# Patient Record
Sex: Male | Born: 1990 | Race: Black or African American | Hispanic: No | Smoking: Former smoker
Health system: Southern US, Community
[De-identification: ages and names within clinical notes are randomized; demographics above are authoritative.]

---

## 2018-04-25 ENCOUNTER — Emergency Department (HOSPITAL_COMMUNITY): Payer: BLUE CROSS/BLUE SHIELD

## 2018-04-25 ENCOUNTER — Encounter (HOSPITAL_COMMUNITY): Payer: Self-pay

## 2018-04-25 ENCOUNTER — Emergency Department (HOSPITAL_COMMUNITY)
Admission: EM | Admit: 2018-04-25 | Discharge: 2018-04-25 | Disposition: A | Discharge status: Home / Self Care | Payer: BLUE CROSS/BLUE SHIELD | Attending: Emergency Medicine | Admitting: Emergency Medicine

## 2018-04-25 ENCOUNTER — Other Ambulatory Visit: Payer: Self-pay

## 2018-04-25 DIAGNOSIS — L97919 Non-pressure chronic ulcer of unspecified part of right lower leg with unspecified severity: Secondary | ICD-10-CM | POA: Diagnosis not present

## 2018-04-25 DIAGNOSIS — L97929 Non-pressure chronic ulcer of unspecified part of left lower leg with unspecified severity: Secondary | ICD-10-CM

## 2018-04-25 DIAGNOSIS — D649 Anemia, unspecified: Secondary | ICD-10-CM

## 2018-04-25 DIAGNOSIS — M79606 Pain in leg, unspecified: Secondary | ICD-10-CM | POA: Diagnosis present

## 2018-04-25 LAB — CBC WITH DIFFERENTIAL/PLATELET
Abs Immature Granulocytes: 0.01 10*3/uL (ref 0.00–0.07)
Basophils Absolute: 0 10*3/uL (ref 0.0–0.1)
Basophils Relative: 0 %
EOS PCT: 2 %
Eosinophils Absolute: 0.1 10*3/uL (ref 0.0–0.5)
HCT: 37.4 % — ABNORMAL LOW (ref 39.0–52.0)
Hemoglobin: 11.5 g/dL — ABNORMAL LOW (ref 13.0–17.0)
Immature Granulocytes: 0 %
Lymphocytes Relative: 26 %
Lymphs Abs: 1.9 10*3/uL (ref 0.7–4.0)
MCH: 29 pg (ref 26.0–34.0)
MCHC: 30.7 g/dL (ref 30.0–36.0)
MCV: 94.4 fL (ref 80.0–100.0)
MONOS PCT: 8 %
Monocytes Absolute: 0.6 10*3/uL (ref 0.1–1.0)
Neutro Abs: 4.6 10*3/uL (ref 1.7–7.7)
Neutrophils Relative %: 64 %
Platelets: 383 10*3/uL (ref 150–400)
RBC: 3.96 MIL/uL — ABNORMAL LOW (ref 4.22–5.81)
RDW: 12.4 % (ref 11.5–15.5)
WBC: 7.3 10*3/uL (ref 4.0–10.5)
nRBC: 0 % (ref 0.0–0.2)

## 2018-04-25 LAB — BASIC METABOLIC PANEL WITH GFR
Anion gap: 11 (ref 5–15)
BUN: 7 mg/dL (ref 6–20)
CO2: 25 mmol/L (ref 22–32)
Calcium: 10 mg/dL (ref 8.9–10.3)
Chloride: 106 mmol/L (ref 98–111)
Creatinine, Ser: 1.08 mg/dL (ref 0.61–1.24)
GFR calc Af Amer: >60 mL/min (ref >60)
GFR calc non Af Amer: >60 mL/min (ref >60)
Glucose, Bld: 98 mg/dL (ref 70–99)
Potassium: 3.8 mmol/L (ref 3.5–5.1)
Sodium: 142 mmol/L (ref 135–145)

## 2018-04-25 LAB — CBG MONITORING, ED: Glucose-Capillary: 91 mg/dL (ref 70–99)

## 2018-04-25 LAB — I-STAT CG4 LACTIC ACID, ED: Lactic Acid, Venous: 0.87 mmol/L (ref 0.5–1.9)

## 2018-04-25 MED ORDER — VANCOMYCIN HCL 10 G IV SOLR
1500.0000 mg | INTRAVENOUS | Status: AC
  Administered 2018-04-25: 1500 mg via INTRAVENOUS
  Filled 2018-04-25: qty 1500

## 2018-04-25 MED ORDER — VANCOMYCIN HCL 10 G IV SOLR
1250.0000 mg | Freq: Two times a day (BID) | INTRAVENOUS | Status: DC
  Filled 2018-04-25: qty 1250

## 2018-04-25 MED ORDER — SODIUM CHLORIDE 0.9 % IV BOLUS
1000.0000 mL | Freq: Once | INTRAVENOUS | Status: AC
  Administered 2018-04-25: 1000 mL via INTRAVENOUS

## 2018-04-25 MED ORDER — SULFAMETHOXAZOLE-TRIMETHOPRIM 800-160 MG PO TABS: 1.0000 | ORAL_TABLET | Freq: Two times a day (BID) | ORAL | 0 refills | Status: AC

## 2018-04-25 MED ORDER — CEPHALEXIN 500 MG PO CAPS: 500.0000 mg | ORAL_CAPSULE | Freq: Four times a day (QID) | ORAL | 0 refills | Status: AC

## 2018-04-25 NOTE — ED Triage Notes (Signed)
Pt presents with full thickness wounds on BLE.  Pt reports first wound on his left shin started around 2-3 months ago and the right LE started 2-3 weeks ago.  Pt reports usually working  12hrs shifts with no issues on his feet however now c/o severe burning sensation after only 4 hours.  Pt denies hx of DM, however "I haven't been to the Dr. In years" states pt.  Pt denies fevers.  Pt is tachycardic upon arrival ED.

## 2018-04-25 NOTE — ED Provider Notes (Signed)
[facility] EMERGENCY DEPARTMENT Provider Note   CSN: [id number] Arrival date & time: 04/25/18  1348     History   Chief Complaint Chief Complaint  Patient presents with  . Leg Pain    HPI Carlos Lowe is a 27 y.o. male presented today for 77-month history of wounds to bilateral lower legs.  States that he has a mild intensity throbbing pain to bilateral lower legs around the wounds that is constant and worsened with palpation and standing for long periods of time.  Patient denies known injury or trauma to the area.  Patient denies insect bites.  Patient states that he has otherwise been feeling well, denies history of fever, nausea/vomiting, abdominal pain or any other concerns at this time.  Patient denies numbness/weakness or tingling to his extremities.  Patient denies IV drug use, cocaine use, history of diabetes or other immunocompromising diseases.  HPI  History reviewed. No pertinent past medical history.  There are no active problems to display for this patient.   History reviewed. No pertinent surgical history.      Home Medications    Prior to Admission medications   Not on File    Family History Family History  Problem Relation Age of Onset  . Diabetes Mother   . Hypertension Mother   . Heart failure Mother   . Stroke Mother     Social History Social History   Tobacco Use  . Smoking status: Never Smoker  . Smokeless tobacco: Never Used  Substance Use Topics  . Alcohol use: Not Currently    Frequency: Never  . Drug use: Yes    Types: Marijuana     Allergies   Patient has no known allergies.   Review of Systems Review of Systems  Constitutional: Negative.  Negative for chills and fever.  HENT: Negative.  Negative for rhinorrhea and sore throat.   Eyes: Negative.  Negative for visual disturbance.  Respiratory: Negative.  Negative for cough and shortness of breath.   Cardiovascular: Negative.  Negative for chest  pain.  Gastrointestinal: Negative.  Negative for abdominal pain, diarrhea, nausea and vomiting.  Genitourinary: Negative.  Negative for dysuria and hematuria.  Musculoskeletal: Negative.  Negative for arthralgias and myalgias.  Skin: Positive for color change and wound. Negative for rash.  Neurological: Negative.  Negative for dizziness, weakness and headaches.   Physical Exam Updated Vital Signs BP (!) 156/75 (BP Location: Right Arm)   Pulse (!) 132   Temp 98.9 F (37.2 C) (Oral)   Resp 18   Ht 5\' 9"  (1.753 m)   Wt 73.9 kg   SpO2 100%   BMI 24.07 kg/m   Physical Exam  Constitutional: He appears well-developed and well-nourished. No distress.  HENT:  Head: Normocephalic and atraumatic.  Right Ear: External ear normal.  Left Ear: External ear normal.  Nose: Nose normal.  Eyes: Pupils are equal, round, and reactive to light. EOM are normal.  Neck: Trachea normal and normal range of motion. No tracheal deviation present.  Cardiovascular: Normal rate, regular rhythm, normal heart sounds and intact distal pulses.  Pulses:      Dorsalis pedis pulses are 2+ on the right side, and 2+ on the left side.       Posterior tibial pulses are 2+ on the right side, and 2+ on the left side.  Pulmonary/Chest: Effort normal and breath sounds normal. No respiratory distress.  Abdominal: Soft. There is no tenderness. There is no rebound and no guarding.  Musculoskeletal: Normal range of motion.  Feet:  Right Foot:  Protective Sensation: 3 sites tested. 3 sites sensed.  Left Foot:  Protective Sensation: 3 sites tested. 3 sites sensed.  Neurological: He is alert. GCS eye subscore is 4. GCS verbal subscore is 5. GCS motor subscore is 6.  Speech is clear and goal oriented, follows commands Major Cranial nerves without deficit, no facial droop Normal strength in upper and lower extremities bilaterally including dorsiflexion and plantar flexion, strong and equal grip strength Sensation normal to  light touch Moves extremities without ataxia, coordination intact Normal gait  Skin: Skin is warm and dry. Capillary refill takes less than 2 seconds.  Right lower extremity: Patient with chronic appearing wound to right lower extremity, lateral side, see first picture.  Left lower extremity: Patient with chronic appearing wound, anterior lateral side, see second picture.  Psychiatric: He has a normal mood and affect. His behavior is normal.        ED Treatments / Results  Labs (all labs ordered are listed, but only abnormal results are displayed) Labs Reviewed  CULTURE, BLOOD (ROUTINE X 2)  CULTURE, BLOOD (ROUTINE X 2)  CBC WITH DIFFERENTIAL/PLATELET  BASIC METABOLIC PANEL  HIV ANTIBODY (ROUTINE TESTING W REFLEX)  RPR  CBG MONITORING, ED  CBG MONITORING, ED  I-STAT CG4 LACTIC ACID, ED    EKG None  Radiology Dg Tibia/fibula Left  Result Date: 04/25/2018 CLINICAL DATA:  Bilateral lower leg open sores for the past 3 months. EXAM: LEFT TIBIA AND FIBULA - 2 VIEW COMPARISON:  None. FINDINGS: Small oval lucency projected lateral to the distal shaft of the fibula in the frontal projection, not seen in the lateral projection. Otherwise, normal appearing bones and soft tissues. IMPRESSION: Possible small lipoma adjacent to the lateral aspect of the distal shaft of the fibula. This could also represent an overlying shallow skin ulceration. Electronically Signed   By: Beckie Salts M.D.   On: 04/25/2018 15:19   Dg Tibia/fibula Right  Result Date: 04/25/2018 CLINICAL DATA:  Bilateral lower leg open sores for the past 2 months. EXAM: RIGHT TIBIA AND FIBULA - 2 VIEW COMPARISON:  None. FINDINGS: Oval lucency in the superficial soft tissues of the lower leg laterally. There is also an elongated, circumscribed area of increased density in the distal shaft of the fibula with no bone expansion or aggressive features. Otherwise, unremarkable bones and soft tissues. IMPRESSION: Small superficial  ulceration in the lateral aspect of the distal lower leg with no associated soft tissue gas or underlying bony abnormality. Electronically Signed   By: Beckie Salts M.D.   On: 04/25/2018 15:22   Dg Foot Complete Right  Result Date: 04/25/2018 CLINICAL DATA:  Small open sores in both lower legs and on the right great toe below the toenail. EXAM: RIGHT FOOT COMPLETE - 3+ VIEW COMPARISON:  None. FINDINGS: Unremarkable bones and soft tissues. No soft tissue gas, bone destruction or periosteal reaction. IMPRESSION: No acute abnormality. Electronically Signed   By: Beckie Salts M.D.   On: 04/25/2018 15:20    Procedures Procedures (including critical care time)  Medications Ordered in ED Medications  sodium chloride 0.9 % bolus 1,000 mL (has no administration in time range)     Initial Impression / Assessment and Plan / ED Course  I have reviewed the triage vital signs and the nursing notes.  Pertinent labs & imaging results that were available during my care of the patient were reviewed by me and considered in my medical  decision making (see chart for details).    27 year old seemingly otherwise healthy male presenting today for wounds to bilateral lower extremities.  Wounds appear chronic, nonhealing.  Patient states that they have been there for greater than 3 months.  Upon arrival patient was noted to be tachycardic which prompted large work-up today.  Patient's tachycardia has resolved spontaneously on reevaluation.  CBC nonacute I-STAT lactic acid within normal limits CBG 91 Afebrile, not tachycardic, not hypotensive, well-appearing and in no acute distress. Plain films today show ulceration of the skin. Patient has been given 1 L bolus and dose of vancomycin to cover for MRSA. -------------------------------------------------------------------------------------- Care handoff has been given to Aviva KluverAlyssa Murray, PA-C at shift change.  Plan at this time is to await return of blood work and  disposition.  Patient's case discussed with Dr. Juleen ChinaKohut who agrees with plan above at this time.  Note: Portions of this report may have been transcribed using voice recognition software. Every effort was made to ensure accuracy; however, inadvertent computerized transcription errors may still be present.  Final Clinical Impressions(s) / ED Diagnoses   Final diagnoses:  None    ED Discharge Orders    None       Elizabeth PalauMorelli, Briteny Fulghum A, PA-C 04/25/18 1625    Raeford RazorKohut, Stephen, MD 04/26/18 1542

## 2018-04-25 NOTE — Progress Notes (Signed)
Pharmacy Antibiotic Note  Carlos Lowe is a 27 y.o. male admitted on 04/25/2018 with cellulitis.  Pharmacy has been consulted for Vancomycin dosing.  CC/HPI: Wound son B LE. Severe burning sensation.  ID: Cellulitis. Afebrile. LA WNL. WBC WNL  Vancomycin 1250 mg IV Q 12 hrs. Goal AUC 400-550. Expected AUC: 532.9 SCr used: 1.08   Plan: Vanco 1500mg  IV 1 in ED then 1250mg  IV q 12h Levels at steady state.    Height: 5\' 9"  (175.3 cm) Weight: 163 lb (73.9 kg) IBW/kg (Calculated) : 70.7  Temp (24hrs), Avg:98.9 F (37.2 C), Min:98.9 F (37.2 C), Max:98.9 F (37.2 C)  Recent Labs  Lab 04/25/18 1422 04/25/18 1556  WBC 7.3  --   CREATININE 1.08  --   LATICACIDVEN  --  0.87    Estimated Creatinine Clearance: 100.9 mL/min (by C-G formula based on SCr of 1.08 mg/dL).    No Known Allergies   Carlos Lowe, PharmD, BCPS Clinical Staff Pharmacist Misty StanleyRobertson, Carleen Rhue Stillinger 04/25/2018 4:48 PM

## 2018-04-25 NOTE — Discharge Instructions (Addendum)
Please see the information and instructions below regarding your visit.  Your diagnoses today include:  1. Ulcer of left lower extremity, unspecified ulcer stage (HCC)   2. Ulcer of right lower extremity, unspecified ulcer stage (HCC)   3. Normocytic anemia    Please take your antibiotics as prescribed for their ENTIRE prescribed duration.   Tests performed today include: See side panel of your discharge paperwork for testing performed today. Vital signs are listed at the bottom of these instructions.   Medications prescribed:    Take any prescribed medications only as prescribed, and any over the counter medications only as directed on the packaging.  1. Please take all of your antibiotics until finished.   You may develop abdominal discomfort or nausea from the antibiotic. If this occurs, you may take it with food. Some patients also get diarrhea with antibiotics. You may help offset this with probiotics which you can buy or get in yogurt. Do not eat or take the probiotics until 2 hours after your antibiotic. Some women develop vaginal yeast infections after antibiotics. If you develop unusual vaginal discharge after being on this medication, please see your primary care provider.   Some people develop allergies to antibiotics. Symptoms of antibiotic allergy can be mild and include a flat rash and itching. They can also be more serious and include:  ?Hives - Hives are raised, red patches of skin that are usually very itchy.  ?Lip or tongue swelling  ?Trouble swallowing or breathing  ?Blistering of the skin or mouth.  If you have any of these serious symptoms, please seek emergency medical care immediately.  Home care instructions:  Please follow any educational materials contained in this packet.   Keep affected area above the level of your heart when possible.   Please perform the dressing changes every 48 hours until you are evaluated by primary care or a specialist.  He will  place the yellow gauze on the bottom, and then the white gauzes on top.  This will promote a clean wound.   Follow-up instructions: Please follow-up with your primary care provider per your previously scheduled appointment.   Return instructions:  Please return to the Emergency Department if you experience worsening symptoms. Call your doctor sooner or return to the ER if you develop worsening signs of infection such as: increased redness, increased pain, pus, fever, or other symptoms that concern you.  Please return if you have any other emergent concerns.  Additional Information:   Your vital signs today were: BP 116/61 (BP Location: Left Arm)    Pulse 66    Temp 98.9 F (37.2 C) (Oral)    Resp 16    Ht 5\' 9"  (1.753 m)    Wt 73.9 kg    SpO2 100%    BMI 24.07 kg/m  If your blood pressure (BP) was elevated on multiple readings during this visit above 130 for the top number or above 80 for the bottom number, please have this repeated by your primary care provider within one month. --------------  Thank you for allowing us to participate in your care today. It was a pleasure taking care of you today!

## 2018-04-25 NOTE — ED Provider Notes (Signed)
Physical Exam  BP 116/61 (BP Location: Left Arm)   Pulse 66   Temp 98.9 F (37.2 C) (Oral)   Resp 16   Ht 5\' 9"  (1.753 m)   Wt 73.9 kg   SpO2 100%   BMI 24.07 kg/m   Assumed care from [facility], PA-C at 1600. Briefly, the patient is a 27 y.o. Lowe with PMHx of  has no past medical history on file. here with bilateral pretibial ulcers.  Patient reporting that the one on the left lower extremity has been present for approximately 3 months, and one in the right lower extremity approximately 2 months.  He reports that his emergency department visit is prompted by inability to stand for 12 hours at work without burning pain over these ulcerations.  Patient initially markedly tachycardic on arrival to 132, and made code sepsis. Received one dose of vancomycin. Tachycardia improved spontaneously.  Patient has been afebrile.  No other abnormal vital signs.  Labs Reviewed  CBC WITH DIFFERENTIAL/PLATELET - Abnormal; Notable for the following components:      Result Value   RBC 3.96 (*)    Hemoglobin 11.5 (*)    HCT 37.4 (*)    All other components within normal limits  CULTURE, BLOOD (ROUTINE X 2)  CULTURE, BLOOD (ROUTINE X 2)  BASIC METABOLIC PANEL  HIV ANTIBODY (ROUTINE TESTING W REFLEX)  RPR  CBG MONITORING, ED  CBG MONITORING, ED  I-STAT CG4 LACTIC ACID, ED    Course of Care:   Physical Exam  Constitutional: He appears well-developed and well-nourished. No distress.  Sitting comfortably in bed.  HENT:  Head: Normocephalic and atraumatic.  Eyes: Conjunctivae are normal. Right eye exhibits no discharge. Left eye exhibits no discharge.  EOMs normal to gross examination.  Neck: Normal range of motion.  Cardiovascular: Normal rate and regular rhythm.  Intact, 2+ and equal DP pulses.   Pulmonary/Chest:  Normal respiratory effort. Patient converses comfortably. No audible wheeze or stridor.  Abdominal: He exhibits no distension.  Musculoskeletal: Normal range of motion.   Please see the previous providers note for clinical photos.  Patient has bilateral pretibial, ulcerated lesions each approximately 1 cm in diameter with surrounding erythema extending approximately 3 cm outward.  No purulence.  Neurological: He is alert.  Cranial nerves intact to gross observation. Patient moves extremities without difficulty.  Skin: Skin is warm and dry. He is not diaphoretic.  Psychiatric: He has a normal mood and affect. His behavior is normal. Judgment and thought content normal.  Nursing note and vitals reviewed.   ED Course/Procedures     Procedures  MDM  Patient nontoxic-appearing, afebrile, hemodynamically stable, and in no acute distress.  Patient with chronic ulcerations on bilateral pretibial regions.  Differential diagnosis includes chronic infections, autoimmune disease, venous stasis, trauma.  Patient is denying any trauma, IVDU, cocaine use, or any history of personal autoimmune disease.  His mother does have lupus.  Given surrounding erythema, will treat with Bactrim and Keflex.  Patient is following up with primary care provider tomorrow, and I discussed with the patient that is very important that he ultimately obtain follow-up with wound care and dermatology.  Wound care consultation for referral was placed today.  Return precautions were given for any spreading erythema, purulent drainage, fever or chills, or any new or worsening symptoms.  Patient is in understanding and agrees with the plan of care.  Patient is going up with his primary care provider with whom he is establishing care tomorrow.  Elisha PonderMurray, Hailea Eaglin B, PA-C 04/25/18 1924    Raeford RazorKohut, Stephen, MD 04/26/18 [phone]

## 2018-04-26 DIAGNOSIS — L97919 Non-pressure chronic ulcer of unspecified part of right lower leg with unspecified severity: Secondary | ICD-10-CM | POA: Insufficient documentation

## 2018-04-26 LAB — HIV ANTIBODY (ROUTINE TESTING W REFLEX): HIV Screen 4th Generation wRfx: NONREACTIVE

## 2018-04-26 LAB — RPR: RPR Ser Ql: NONREACTIVE

## 2018-04-30 LAB — CULTURE, BLOOD (ROUTINE X 2)
Culture: NO GROWTH
Culture: NO GROWTH

## 2019-04-04 IMAGING — DX DG TIBIA/FIBULA 2V*L*
4 series · 4 of 4 positions shown · non-contrast
Comparison: None.

CLINICAL DATA: Bilateral lower leg open sores for the past 3
months.

EXAM:
LEFT TIBIA AND FIBULA - 2 VIEW

[x tib-fib ap left (1 of 2)]
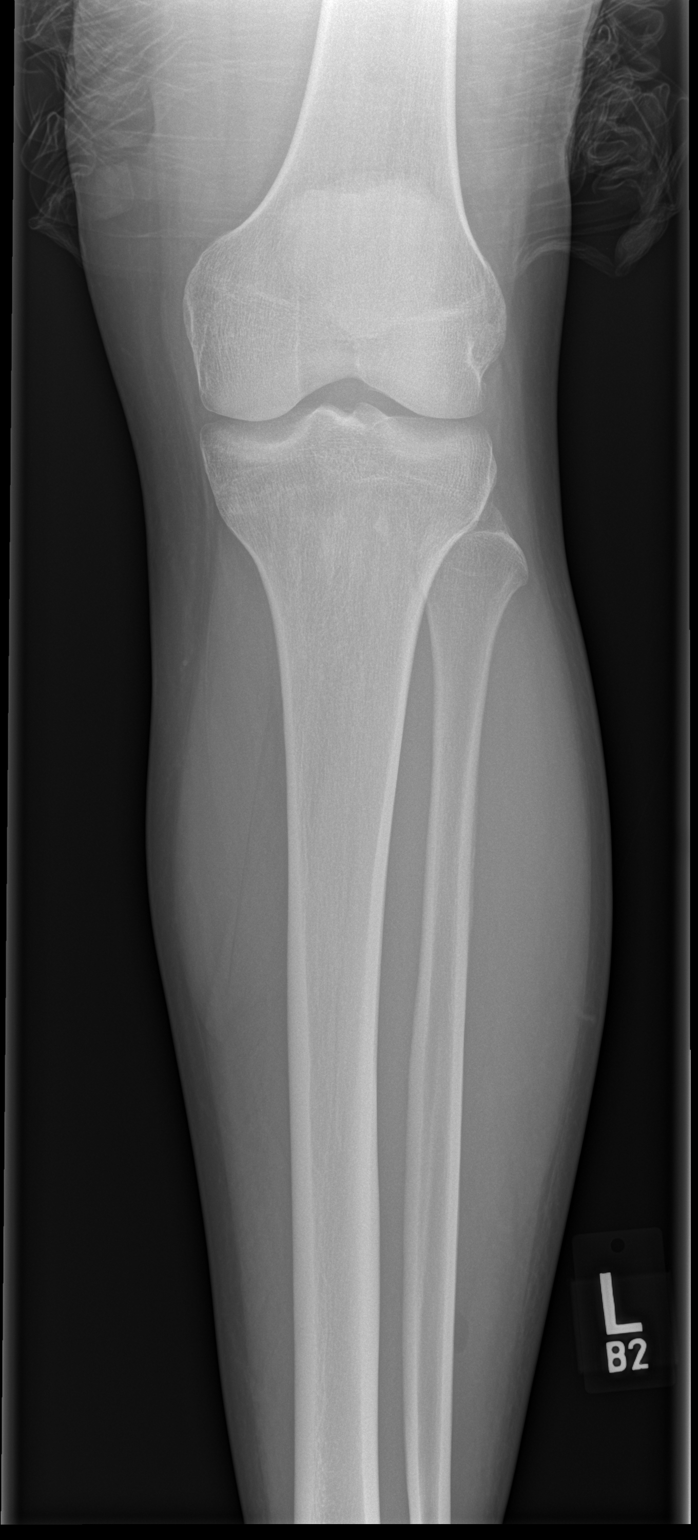

[x tib-fib ap left (2 of 2)]
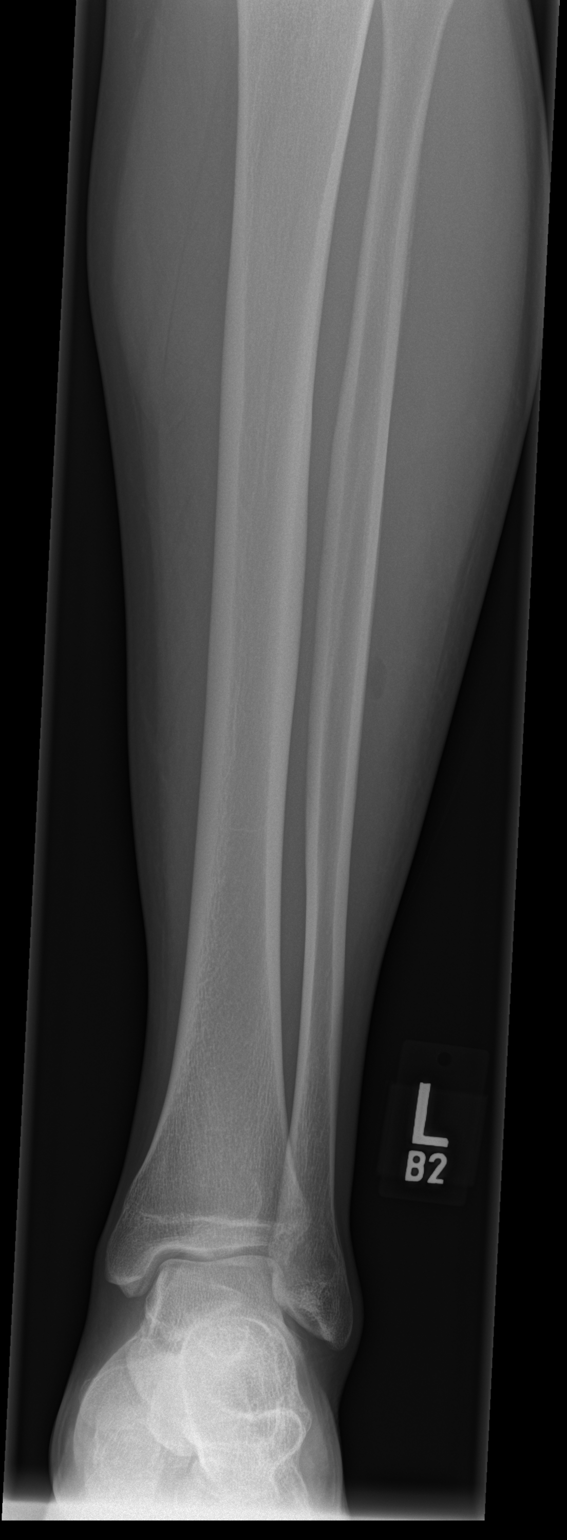

[x tib-fib lat left (1 of 2)]
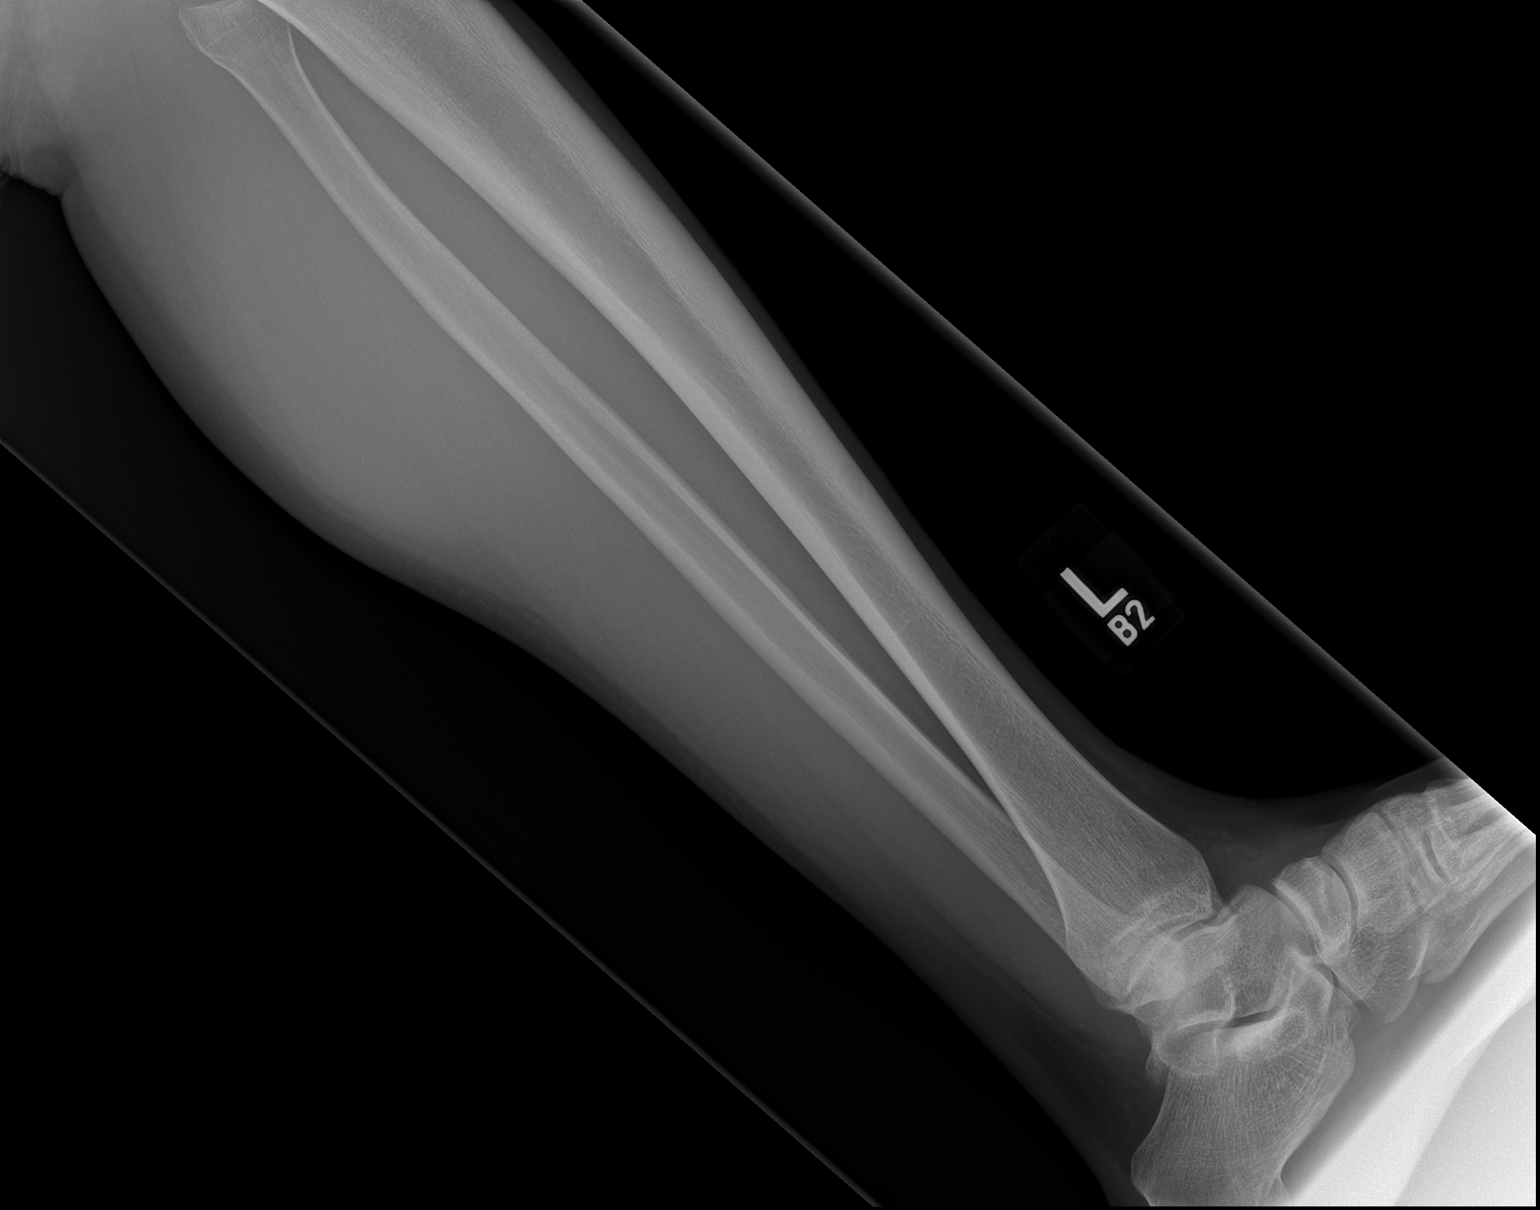

[x tib-fib lat left (2 of 2)]
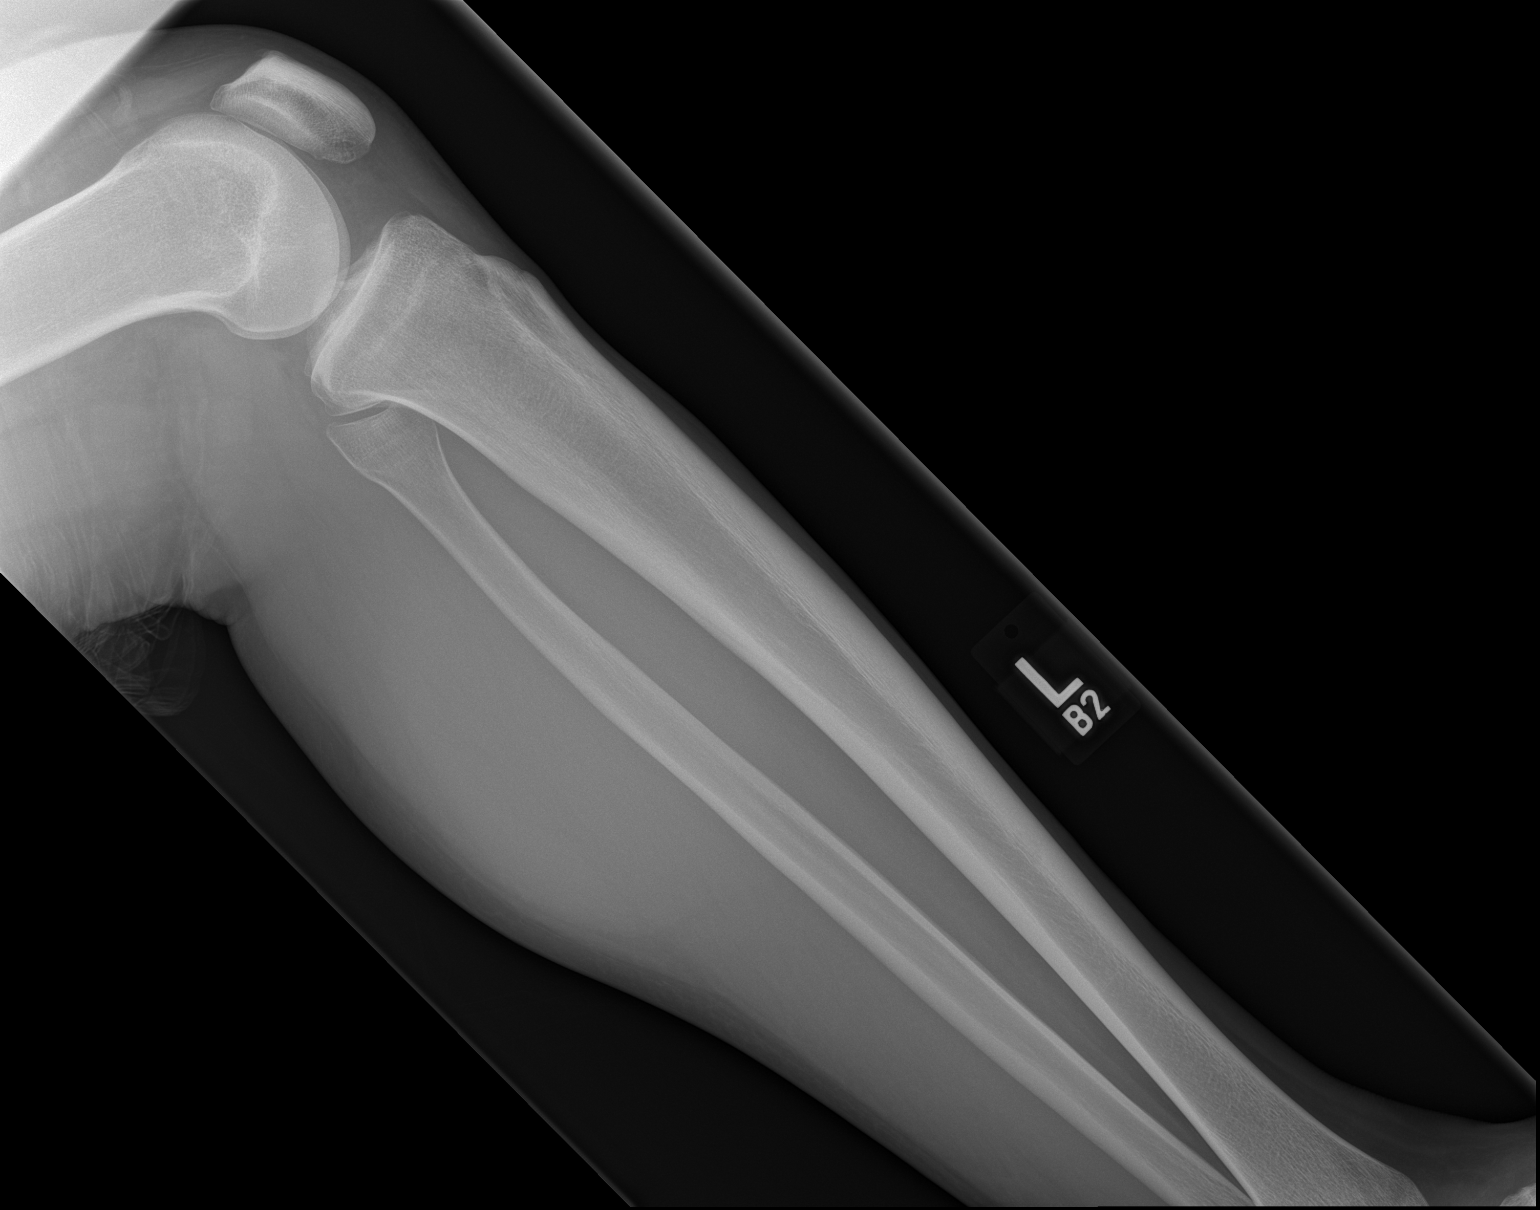

[4 of 4 positions shown; findings below may reference images not displayed]

FINDINGS: Small oval lucency projected lateral to the distal shaft of the
fibula in the frontal projection, not seen in the lateral
projection. Otherwise, normal appearing bones and soft tissues.
IMPRESSION: Possible small lipoma adjacent to the lateral aspect of the distal
shaft of the fibula. This could also represent an overlying shallow
skin ulceration.

## 2020-06-09 DIAGNOSIS — Z20822 Contact with and (suspected) exposure to covid-19: Secondary | ICD-10-CM | POA: Diagnosis not present

## 2020-11-05 DIAGNOSIS — R55 Syncope and collapse: Secondary | ICD-10-CM | POA: Diagnosis not present

## 2020-11-05 DIAGNOSIS — Z6826 Body mass index (BMI) 26.0-26.9, adult: Secondary | ICD-10-CM | POA: Diagnosis not present

## 2020-11-05 DIAGNOSIS — L739 Follicular disorder, unspecified: Secondary | ICD-10-CM | POA: Diagnosis not present

## 2020-11-05 DIAGNOSIS — L03116 Cellulitis of left lower limb: Secondary | ICD-10-CM | POA: Diagnosis not present

## 2020-11-25 ENCOUNTER — Other Ambulatory Visit: Payer: Self-pay

## 2020-11-25 ENCOUNTER — Encounter: Payer: Self-pay | Admitting: Family Medicine

## 2020-11-25 ENCOUNTER — Ambulatory Visit: Payer: BC Managed Care – PPO | Admitting: Family Medicine

## 2020-11-25 VITALS — BP 152/94 | HR 74 | Temp 98.2°F | Ht 69.0 in | Wt 194.0 lb

## 2020-11-25 DIAGNOSIS — G4719 Other hypersomnia: Secondary | ICD-10-CM | POA: Diagnosis not present

## 2020-11-25 DIAGNOSIS — E038 Other specified hypothyroidism: Secondary | ICD-10-CM | POA: Insufficient documentation

## 2020-11-25 DIAGNOSIS — Z87898 Personal history of other specified conditions: Secondary | ICD-10-CM | POA: Diagnosis not present

## 2020-11-25 DIAGNOSIS — R55 Syncope and collapse: Secondary | ICD-10-CM | POA: Insufficient documentation

## 2020-11-25 DIAGNOSIS — R030 Elevated blood-pressure reading, without diagnosis of hypertension: Secondary | ICD-10-CM

## 2020-11-25 NOTE — Patient Instructions (Signed)
-   Use referrals to follow-up with Neurology and ENT (Sleep Medicine), our referral coordinator will contact you - Can research in-network specialists and contact us if referral needs to be updated  - Obtain blood work in 2 months (few days before visit) with order provided and return for follow-up - Contact for any questions

## 2020-11-25 NOTE — Progress Notes (Signed)
Primary Care / Sports Medicine Office Visit  Patient Information:  Patient ID: Carlos Lowe, male DOB: July 02, 1990 Age: 30 y.o. MRN: [id number]   Carlos Lowe is a pleasant 30 y.o. male presenting with the following:  Chief Complaint  Patient presents with   New Patient (Initial Visit)   Establish Care   Genia Hotter at work from syncope x3 weeks ago; has aura of sweating, dizziness, and fatigue; history of seizures in childhood; no currently taking any medication    Review of Systems pertinent details above   Patient Active Problem List   Diagnosis Date Noted   History of seizures 11/25/2020   Excessive daytime sleepiness 11/25/2020   Subclinical hypothyroidism 11/25/2020   Blood pressure elevated without history of HTN 11/25/2020   Syncope 11/25/2020   Ulcers of both lower legs (HCC) 04/26/2018   History reviewed. No pertinent past medical history.  No outpatient encounter medications on file as of 11/25/2020.   No facility-administered encounter medications on file as of 11/25/2020.   History reviewed. No pertinent surgical history.  Vitals:   11/25/20 0858  BP: (!) 152/94  Pulse: 74  Temp: 98.2 F (36.8 C)  SpO2: 100%   Vitals:   11/25/20 0858  Weight: 194 lb (88 kg)  Height: 5\' 9"  (1.753 m)   Body mass index is 28.65 kg/m.  No results found.   Independent interpretation of notes and tests performed by another provider:   None  Procedures performed:   None  Pertinent History, Exam, Impression, and Recommendations:   Excessive daytime sleepiness Patient with Epworth score 18 in the setting of questionable LOC/syncopal episode while at work falling with impact to his left frontal head necessitating stitches.  Mallampati class I-II, cardiopulmonary examination benign, he does demonstrate elevated blood pressure on today's visit.  I have advised patient on the nature of his high ESS score in relation to recent episode and need for further  evaluation by sleep medicine/ENT.  He is amenable to this and a referral was placed through our office today.  Plan to coordinate a follow-up after he is seen associated specialists.  Subclinical hypothyroidism Patient with longstanding fatigue, questionable skin manifestations of the lower extremities, positive pression scoring.  Additionally, serum studies obtained through outside facility are consistent with subclinical hypothyroidism.  His physical exam reveals no thyromegaly, no lymphadenopathy, skin at the bilateral anterior tibia reveal chronic appearing ovoid lesions with hyperpigmentation, there is a more acute appearing lesion at his left anterolateral tibia with granulation tissue underlying.  I have advised patient at length on the nature of subclinical hypothyroidism, need for repeat labs to confirm this, associated relation of thyroid dysfunction to his stated concerns.  Plan for recheck of labs prior to his return.  History of seizures Patient with stated history of childhood seizure episodes prior to kindergarten, uncertain if associated febrile illness at time.  He describes no tonic-clonic motions and instead describes absence type seizure, unable to specify timeframe of symptomatology.  He states that he went years being asymptomatic until recently at work where he had an episode of loss of consciousness/syncope resulting in fall and trauma to his left frontal forehead.  He is not currently on any antiepileptic therapy and the history provided by the patient is limited.  On physical exam his cranial nerve testing is benign save for extraocular movements when assessing his left upper quadrant where he has difficulty tracking, otherwise coordination testing including finger-nose and dysdiadochokinesia assessment benign.  Sensorimotor  intact in upper and lower extremities.  Given the extent of his presenting concern and work-related episode, I have advised patient that it would be  appropriate to have further evaluation and possible management with neurology.  He is understanding and amenable to this plan.  We will coordinate follow-up after he is seen neurology and any associated work-up has been completed.  Blood pressure elevated without history of HTN Elevated blood pressure at initial visit, denies any chest pain, lower extremity swelling, does endorse fatigue and mild dyspnea.  Cardiac exam reveals regular rate and rhythm, no additional heart sounds, no murmurs, lung fields are clear to auscultation bilaterally without rales, rhonchi, or wheezes.  Pulses are symmetric, no JVD noted, no lower extremity edema appreciated.  Plan to follow this up at his return visit.  Syncope 30 year old patient with history of stated childhood seizures, excessive daytime sleepiness, chronic fatigue presenting for initial evaluation after a fall at work roughly 3 weeks prior.  He states that he had preceding sweating, dizziness, fatigue, he has not had the symptoms beyond fatigue since childhood.   See additional assessments for pertinent physical exam and plan details.  Differential diagnosis to include cardiogenic, neurogenic, vasovagal, endocrine related.  At this stage appropriate referrals to neurology and ENT/sleep medicine were made, close follow-up for physical and additional labs to be obtained.   I provided a total time of 63 minutes including both face-to-face and non-face-to-face time on 11/25/2020 inclusive of time utilized for medical chart review, information gathering, care coordination with staff, and documentation completion.   Orders & Medications No orders of the defined types were placed in this encounter.  Orders Placed This Encounter  Procedures   TSH   T4, free   T3, free   Ambulatory referral to Neurology   Ambulatory referral to ENT   Ambulatory referral to Neurology     Return in about 2 months (around 01/26/2021) for 40 minute visit follow-up.     Jerrol Banana, MD   Primary Care Sports Medicine [facility]

## 2020-11-25 NOTE — Assessment & Plan Note (Signed)
30 year old patient with history of stated childhood seizures, excessive daytime sleepiness, chronic fatigue presenting for initial evaluation after a fall at work roughly 3 weeks prior.  He states that he had preceding sweating, dizziness, fatigue, he has not had the symptoms beyond fatigue since childhood.   See additional assessments for pertinent physical exam and plan details.  Differential diagnosis to include cardiogenic, neurogenic, vasovagal, endocrine related.  At this stage appropriate referrals to neurology and ENT/sleep medicine were made, close follow-up for physical and additional labs to be obtained.

## 2020-11-25 NOTE — Assessment & Plan Note (Signed)
Patient with Epworth score 18 in the setting of questionable LOC/syncopal episode while at work falling with impact to his left frontal head necessitating stitches.  Mallampati class I-II, cardiopulmonary examination benign, he does demonstrate elevated blood pressure on today's visit.  I have advised patient on the nature of his high ESS score in relation to recent episode and need for further evaluation by sleep medicine/ENT.  He is amenable to this and a referral was placed through our office today.  Plan to coordinate a follow-up after he is seen associated specialists.

## 2020-11-25 NOTE — Assessment & Plan Note (Signed)
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  He describes no tonic-clonic motions and instead describes absence type seizure, unable to specify timeframe of symptomatology.  He states that he went years being asymptomatic until recently at work where he had an episode of loss of consciousness/syncope resulting in fall and trauma to his left frontal forehead.  He is not currently on any antiepileptic therapy and the history provided by the patient is limited.  On physical exam his cranial nerve testing is benign save for extraocular movements when assessing his left upper quadrant where he has difficulty tracking, otherwise coordination testing including finger-nose and dysdiadochokinesia assessment benign.  Sensorimotor intact in upper and lower extremities.  Given the extent of his presenting concern and work-related episode, I have advised patient that it would be appropriate to have further evaluation and possible management with neurology.  He is understanding and amenable to this plan.  We will coordinate follow-up after he is seen neurology and any associated work-up has been completed.

## 2020-11-25 NOTE — Assessment & Plan Note (Signed)
Elevated blood pressure at initial visit, denies any chest pain, lower extremity swelling, does endorse fatigue and mild dyspnea.  Cardiac exam reveals regular rate and rhythm, no additional heart sounds, no murmurs, lung fields are clear to auscultation bilaterally without rales, rhonchi, or wheezes.  Pulses are symmetric, no JVD noted, no lower extremity edema appreciated.  Plan to follow this up at his return visit.

## 2020-11-25 NOTE — Assessment & Plan Note (Signed)
Patient with longstanding fatigue, questionable skin manifestations of the lower extremities, positive pression scoring.  Additionally, serum studies obtained through outside facility are consistent with subclinical hypothyroidism.  His physical exam reveals no thyromegaly, no lymphadenopathy, skin at the bilateral anterior tibia reveal chronic appearing ovoid lesions with hyperpigmentation, there is a more acute appearing lesion at his left anterolateral tibia with granulation tissue underlying.  I have advised patient at length on the nature of subclinical hypothyroidism, need for repeat labs to confirm this, associated relation of thyroid dysfunction to his stated concerns.  Plan for recheck of labs prior to his return.

## 2021-01-28 ENCOUNTER — Ambulatory Visit: Payer: BC Managed Care – PPO | Admitting: Family Medicine

## 2022-12-13 DIAGNOSIS — U071 COVID-19: Secondary | ICD-10-CM | POA: Diagnosis not present

## 2022-12-13 DIAGNOSIS — R509 Fever, unspecified: Secondary | ICD-10-CM | POA: Diagnosis not present

## 2022-12-13 DIAGNOSIS — R5383 Other fatigue: Secondary | ICD-10-CM | POA: Diagnosis not present

## 2023-11-25 ENCOUNTER — Encounter: Payer: Self-pay | Admitting: Emergency Medicine

## 2023-11-25 ENCOUNTER — Emergency Department
Admission: EM | Admit: 2023-11-25 | Discharge: 2023-11-25 | Disposition: A | Discharge status: Home / Self Care | Payer: Self-pay

## 2023-11-25 ENCOUNTER — Other Ambulatory Visit: Payer: Self-pay

## 2023-11-25 DIAGNOSIS — K047 Periapical abscess without sinus: Secondary | ICD-10-CM | POA: Insufficient documentation

## 2023-11-25 LAB — CBC
HCT: 41.7 % (ref 39.0–52.0)
Hemoglobin: 13.9 g/dL (ref 13.0–17.0)
MCH: 30.7 pg (ref 26.0–34.0)
MCHC: 33.3 g/dL (ref 30.0–36.0)
MCV: 92.1 fL (ref 80.0–100.0)
Platelets: 402 K/uL — ABNORMAL HIGH (ref 150–400)
RBC: 4.53 MIL/uL (ref 4.22–5.81)
RDW: 12.6 % (ref 11.5–15.5)
WBC: 8.4 K/uL (ref 4.0–10.5)
nRBC: 0 % (ref 0.0–0.2)

## 2023-11-25 LAB — BASIC METABOLIC PANEL WITH GFR
Anion gap: 10 (ref 5–15)
BUN: 14 mg/dL (ref 6–20)
CO2: 24 mmol/L (ref 22–32)
Calcium: 9.8 mg/dL (ref 8.9–10.3)
Chloride: 102 mmol/L (ref 98–111)
Creatinine, Ser: 1.08 mg/dL (ref 0.61–1.24)
GFR, Estimated: >60 mL/min (ref >60)
Glucose, Bld: 112 mg/dL — ABNORMAL HIGH (ref 70–99)
Potassium: 4.1 mmol/L (ref 3.5–5.1)
Sodium: 136 mmol/L (ref 135–145)

## 2023-11-25 MED ORDER — AMOXICILLIN-POT CLAVULANATE 875-125 MG PO TABS
1.0000 | ORAL_TABLET | Freq: Once | ORAL | Status: AC
  Administered 2023-11-25: 1 via ORAL
  Filled 2023-11-25: qty 1

## 2023-11-25 MED ORDER — AMOXICILLIN-POT CLAVULANATE 875-125 MG PO TABS: 1.0000 | ORAL_TABLET | Freq: Two times a day (BID) | ORAL | 0 refills | Status: AC

## 2023-11-25 MED ORDER — AMOXICILLIN-POT CLAVULANATE 875-125 MG PO TABS: 1.0000 | ORAL_TABLET | Freq: Two times a day (BID) | ORAL | 0 refills | Status: DC

## 2023-11-25 NOTE — ED Provider Notes (Signed)
 [facility] Provider Note    Event Date/Time   First MD Initiated Contact with Patient 11/25/23 0258     (approximate)   History   Dental Pain  Pt to ED via POV with c/o infection to R lower jaw. Pt states tooth has been messed up for a long time. Pt states swelling x 4-5 days however worse today upon awakening. Pt with swelling noted to R lower jaw.    HPI Carlos Lowe is a 33 y.o. male no significant past medical history presents for evaluation of facial swelling, jaw pain - Patient noticed some jaw pain and swelling this evening around 8 PM after waking up.  Notes the swelling has not tiredly resolved in the subsequent 3 hours and feels he has had pus draining into his mouth from a known fractured tooth in his right lower molar region.  Has a dental appointment scheduled this Friday.  Mild pain.  No fever. -No difficulty breathing or speaking     Physical Exam   Triage Vital Signs: ED Triage Vitals  Encounter Vitals Group     BP 11/25/23 0109 (!) 165/101     Girls Systolic BP Percentile --      Girls Diastolic BP Percentile --      Boys Systolic BP Percentile --      Boys Diastolic BP Percentile --      Pulse Rate 11/25/23 0109 (!) 108     Resp 11/25/23 0109 17     Temp 11/25/23 0109 98.6 F (37 C)     Temp Source 11/25/23 0109 Oral     SpO2 11/25/23 0109 100 %     Weight 11/25/23 0110 216 lb (98 kg)     Height 11/25/23 0110 5' 10 (1.778 m)     Head Circumference --      Peak Flow --      Pain Score 11/25/23 0110 0     Pain Loc --      Pain Education --      Exclude from Growth Chart --     Most recent vital signs: Vitals:   11/25/23 0109  BP: (!) 165/101  Pulse: (!) 108  Resp: 17  Temp: 98.6 F (37 C)  SpO2: 100%     General: Awake, no distress.  HEENT: Fractured tooth with caries in right lower molar region.  Does appear to have mild purulence draining at the site, no bulging or fluctuance.  No face or neck swelling  appreciated, no cervical adenopathy, no trismus, no pooling of secretions. CV:  Good peripheral perfusion. Resp:  Normal effort.   ED Results / Procedures / Treatments   Labs (all labs ordered are listed, but only abnormal results are displayed) Labs Reviewed  CBC - Abnormal; Notable for the following components:      Result Value   Platelets 402 (*)    All other components within normal limits  BASIC METABOLIC PANEL WITH GFR - Abnormal; Notable for the following components:   Glucose, Bld 112 (*)    All other components within normal limits     EKG  N/a   RADIOLOGY N/a    PROCEDURES:  Critical Care performed: No  Procedures   MEDICATIONS ORDERED IN ED: Medications  amoxicillin -clavulanate (AUGMENTIN ) 875-125 MG per tablet 1 tablet (1 tablet Oral Given 11/25/23 0416)     IMPRESSION / MDM / ASSESSMENT AND PLAN / ED COURSE  I reviewed the triage vital signs and the nursing notes.  DDX/MDM/AP: Differential diagnosis includes, but is not limited to, dental infection, consider possible recent abscess though if so appears to have drained itself.  Very reassuring exam here with no facial swelling or neck swelling, no lymphadenopathy, and mild purulence apparently at base of fractured right lower molar.  Discussed with patient that we could initiate empiric antibiotics and plan for outpatient follow-up with his dentist this Friday or escalate to CT if he would like to further evaluate this but my clinical concern for significant underlying pathology is low at this time--in shared decision making, patient prefers to defer CT and initiate antibiotics.  Will start Augmentin  and plan for dental follow-up later this week.  Strict ED return precautions in place.  Patient agrees with plan.  No concern for systemic infection at this time.  Plan: -Labs ordered at triage are reassuring, CBC with no leukocytosis - Augmentin  - Tylenol, Motrin as needed -  Dental follow-up - ED return precautions  Patient's presentation is most consistent with acute complicated illness / injury requiring diagnostic workup.     FINAL CLINICAL IMPRESSION(S) / ED DIAGNOSES   Final diagnoses:  Dental infection     Rx / DC Orders   ED Discharge Orders          Ordered    amoxicillin -clavulanate (AUGMENTIN ) 875-125 MG tablet  2 times daily,   Status:  Discontinued        11/25/23 0351    amoxicillin -clavulanate (AUGMENTIN ) 875-125 MG tablet  2 times daily        11/25/23 0418             Note:  This document was prepared using Dragon voice recognition software and may include unintentional dictation errors.   Clarine Ozell LABOR, MD 11/25/23 [phone]

## 2023-11-25 NOTE — Discharge Instructions (Signed)
 Your evaluation in the emergency department was overall reassuring.  I do suspect you likely had a dental abscess, but this appears to be draining if so.  I have started you on an antibiotic to treat this--please take the full course as prescribed and follow-up with your dentist this Friday as already planned.  Return to the emergency department with any new or worsening symptoms.  You can use Tylenol and Motrin as needed for any ongoing discomfort.

## 2023-11-25 NOTE — ED Triage Notes (Signed)
 Pt to ED via POV with c/o infection to R lower jaw. Pt states tooth has been messed up for a long time. Pt states swelling x 4-5 days however worse today upon awakening. Pt with swelling noted to R lower jaw.

## 2023-11-28 ENCOUNTER — Telehealth: Payer: Self-pay

## 2023-11-28 NOTE — Transitions of Care (Post Inpatient/ED Visit) (Signed)
   11/28/2023  Name: Carlos Lowe MRN: [id number] DOB: 1990-10-20  Today's TOC FU Call Status:    Attempted to reach the patient regarding the most recent Inpatient/ED visit.  Follow Up Plan: Additional outreach attempts will be made to reach the patient to complete the Transitions of Care (Post Inpatient/ED visit) call.   Signature   Roetta Chard CMA
